# Patient Record
Sex: Female | Born: 1995 | Race: White | Hispanic: Yes | Marital: Single | Smoking: Former smoker
Health system: Southern US, Community
[De-identification: ages and names within clinical notes are randomized; demographics above are authoritative.]

## PROBLEM LIST (undated history)

## (undated) DIAGNOSIS — J45909 Unspecified asthma, uncomplicated: Secondary | ICD-10-CM

## (undated) DIAGNOSIS — F32A Depression, unspecified: Secondary | ICD-10-CM

## (undated) DIAGNOSIS — F329 Major depressive disorder, single episode, unspecified: Secondary | ICD-10-CM

## (undated) DIAGNOSIS — F419 Anxiety disorder, unspecified: Secondary | ICD-10-CM

## (undated) HISTORY — DX: Major depressive disorder, single episode, unspecified: F32.9

## (undated) HISTORY — DX: Anxiety disorder, unspecified: F41.9

## (undated) HISTORY — DX: Depression, unspecified: F32.A

---
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
## 2015-04-23 ENCOUNTER — Encounter (HOSPITAL_COMMUNITY): Payer: Self-pay | Admitting: Emergency Medicine

## 2015-04-23 ENCOUNTER — Emergency Department (HOSPITAL_COMMUNITY)
Admission: EM | Admit: 2015-04-23 | Discharge: 2015-04-23 | Disposition: A | Discharge status: Home / Self Care | Payer: Federal, State, Local not specified - PPO | Attending: Emergency Medicine | Admitting: Emergency Medicine

## 2015-04-23 ENCOUNTER — Emergency Department (HOSPITAL_COMMUNITY): Payer: Federal, State, Local not specified - PPO

## 2015-04-23 DIAGNOSIS — Z79899 Other long term (current) drug therapy: Secondary | ICD-10-CM | POA: Diagnosis not present

## 2015-04-23 DIAGNOSIS — Y9389 Activity, other specified: Secondary | ICD-10-CM | POA: Diagnosis not present

## 2015-04-23 DIAGNOSIS — S92421A Displaced fracture of distal phalanx of right great toe, initial encounter for closed fracture: Secondary | ICD-10-CM | POA: Insufficient documentation

## 2015-04-23 DIAGNOSIS — S99921A Unspecified injury of right foot, initial encounter: Secondary | ICD-10-CM | POA: Diagnosis present

## 2015-04-23 DIAGNOSIS — Y92410 Unspecified street and highway as the place of occurrence of the external cause: Secondary | ICD-10-CM | POA: Diagnosis not present

## 2015-04-23 DIAGNOSIS — Y998 Other external cause status: Secondary | ICD-10-CM | POA: Insufficient documentation

## 2015-04-23 DIAGNOSIS — Z7951 Long term (current) use of inhaled steroids: Secondary | ICD-10-CM | POA: Insufficient documentation

## 2015-04-23 DIAGNOSIS — Z720 Tobacco use: Secondary | ICD-10-CM | POA: Insufficient documentation

## 2015-04-23 DIAGNOSIS — S92401A Displaced unspecified fracture of right great toe, initial encounter for closed fracture: Secondary | ICD-10-CM

## 2015-04-23 DIAGNOSIS — J45909 Unspecified asthma, uncomplicated: Secondary | ICD-10-CM | POA: Insufficient documentation

## 2015-04-23 MED ORDER — IBUPROFEN 800 MG PO TABS
800.0000 mg | ORAL_TABLET | Freq: Once | ORAL | Status: AC
  Administered 2015-04-23: 800 mg via ORAL
  Filled 2015-04-23: qty 1

## 2015-04-23 MED ORDER — IBUPROFEN 800 MG PO TABS: 800.0000 mg | ORAL_TABLET | Freq: Three times a day (TID) | ORAL | Status: AC
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
## 2015-04-23 NOTE — Discharge Instructions (Signed)
1. Medications: ibuprofen, usual home medications 2. Treatment: rest, drink plenty of fluids, rest, ice, elevate, wear post-op shoe 3. Follow Up: please followup with orthopedics for discussion of your diagnoses and further evaluation after today's visit; please return to the ER for severe pain or swelling, numbness, weakness, new or worsening symptoms   Toe Fracture A toe fracture is a break in one of the toe bones (phalanges). CAUSES This condition may be caused by:  Dropping a heavy object on your toe.  Stubbing your toe.  Overusing your toe or doing repetitive exercise.  Twisting or stretching your toe out of place. RISK FACTORS This condition is more likely to develop in people who:  Play contact sports.  Have a bone disease.  Have a low calcium level. SYMPTOMS The main symptoms of this condition are swelling and pain in the toe. The pain may get worse with standing or walking. Other symptoms include:  Bruising.  Stiffness.  Numbness.  A change in the way the toe looks.  Broken bones that poke through the skin.  Blood beneath the toenail. DIAGNOSIS This condition is diagnosed with a physical exam. You may also have X-rays. TREATMENT  Treatment for this condition depends on the type of fracture and its severity. Treatment may involve:  Taping the broken toe to a toe that is next to it (buddy taping). This is the most common treatment for fractures in which the bone has not moved out of place (nondisplaced fracture).  Wearing a shoe that has a wide, rigid sole to protect the toe and to limit its movement.  Wearing a walking cast.  Having a procedure to move the toe back into place.  Surgery. This may be needed:  If there are many pieces of broken bone that are out of place (displaced).  If the toe joint breaks.  If the bone breaks through the skin.  Physical therapy. This is done to help regain movement and strength in the toe. You may need follow-up  X-rays to make sure that the bone is healing well and staying in position. HOME CARE INSTRUCTIONS If You Have a Cast:  Do not stick anything inside the cast to scratch your skin. Doing that increases your risk of infection.  Check the skin around the cast every day. Report any concerns to your health care provider. You may put lotion on dry skin around the edges of the cast. Do not apply lotion to the skin underneath the cast.  Do not put pressure on any part of the cast until it is fully hardened. This may take several hours.  Keep the cast clean and dry. Bathing  Do not take baths, swim, or use a hot tub until your health care provider approves. Ask your health care provider if you can take showers. You may only be allowed to take sponge baths for bathing.  If your health care provider approves bathing and showering, cover the cast or bandage (dressing) with a watertight plastic bag to protect it from water. Do not let the cast or dressing get wet. Managing Pain, Stiffness, and Swelling  If you do not have a cast, apply ice to the injured area, if directed.  Put ice in a plastic bag.  Place a towel between your skin and the bag.  Leave the ice on for 20 minutes, 2-3 times per day.  Move your toes often to avoid stiffness and to lessen swelling.  Raise (elevate) the injured area above the level of your  heart while you are sitting or lying down. Driving  Do not drive or operate heavy machinery while taking pain medicine.  Do not drive while wearing a cast on a foot that you use for driving. Activity  Return to your normal activities as directed by your health care provider. Ask your health care provider what activities are safe for you.  Perform exercises daily as directed by your health care provider or physical therapist. Safety  Do not use the injured limb to support your body weight until your health care provider says that you can. Use crutches or other assistive devices  as directed by your health care provider. General Instructions  If your toe was treated with buddy taping, follow your health care provider's instructions for changing the gauze and tape. Change it more often:  The gauze and tape get wet. If this happens, dry the space between the toes.  The gauze and tape are too tight and cause your toe to become pale or numb.  Wear a protective shoe as directed by your health care provider. If you were not given a protective shoe, wear sturdy, supportive shoes. Your shoes should not pinch your toes and should not fit tightly against your toes.  Do not use any tobacco products, including cigarettes, chewing tobacco, or e-cigarettes. Tobacco can delay bone healing. If you need help quitting, ask your health care provider.  Take medicines only as directed by your health care provider.  Keep all follow-up visits as directed by your health care provider. This is important. SEEK MEDICAL CARE IF:  You have a fever.  Your pain medicine is not helping.  Your toe is cold.  Your toe is numb.  You still have pain after one week of rest and treatment.  You still have pain after your health care provider has said that you can start walking again.  You have pain, tingling, or numbness in your foot that is not going away. SEEK IMMEDIATE MEDICAL CARE IF:  You have severe pain.  You have redness or inflammation in your toe that is getting worse.  You have pain or numbness in your toe that is getting worse.  Your toe turns blue.   This information is not intended to replace advice given to you by your health care provider. Make sure you discuss any questions you have with your health care provider.   Document Released: 06/01/2000 Document Revised: 02/23/2015 Document Reviewed: 03/31/2014 Elsevier Interactive Patient Education 2016 [employer].   Emergency Department Resource Guide 1) Find a Doctor and Pay Out of Pocket Although you won't have to  find out who is covered by your insurance plan, it is a good idea to ask around and get recommendations. You will then need to call the office and see if the doctor you have chosen will accept you as a new patient and what types of options they offer for patients who are self-pay. Some doctors offer discounts or will set up payment plans for their patients who do not have insurance, but you will need to ask so you aren't surprised when you get to your appointment.  2) Contact Your Local Health Department Not all health departments have doctors that can see patients for sick visits, but many do, so it is worth a call to see if yours does. If you don't know where your local health department is, you can check in your phone book. The CDC also has a tool to help you locate your state's health  department, and many state websites also have listings of all of their local health departments.  3) Find a Walk-in Clinic If your illness is not likely to be very severe or complicated, you may want to try a walk in clinic. These are popping up all over the country in pharmacies, drugstores, and shopping centers. They're usually staffed by nurse practitioners or physician assistants that have been trained to treat common illnesses and complaints. They're usually fairly quick and inexpensive. However, if you have serious medical issues or chronic medical problems, these are probably not your best option.  No Primary Care Doctor: - Call Health Connect at  [phone] - they can help you locate a primary care doctor that  accepts your insurance, provides certain services, etc. - Physician Referral Service- [phone]  Chronic Pain Problems: Organization         Address  Phone   Notes  [facility]  [phone] Patients need to be referred by their primary care doctor.   Medication Assistance: Organization         Address  Phone   Notes  [facility] Medication [facility] [address] [place], [place] [address] [phone] --Must be a resident of [facility] -- Must have NO insurance coverage whatsoever (no Medicaid/ Medicare, etc.) -- The pt. MUST have a primary care doctor that directs their care regularly and follows them in the community   [facility]  [phone]   [employer]  [phone]    Agencies that provide inexpensive medical care: Organization         Address  Phone   Notes  [facility]  [phone]   [facility]    [phone]   [facility] [address] [place], [place] [address] [phone]   [facility] [address] [place] [phone]   Planned Parenthood    [phone]   [facility]    [phone]   [facility]  [address], [place] Phone:  [phone], Fax:  [phone] Hours of Operation:  9 am - 6 pm, M-F.  Also accepts Medicaid/Medicare and self-pay.  [facility]  [address], [place] Phone: [phone], Fax: [phone]. Hours of Operation:  8:30 am - 5:30 pm, M-F.  Also accepts Medicaid and self-pay.  [facility] [place] [address] [place] Phone: [phone]   [facility] [address] Natasha Bence [place], [place] [phone], Ext. [phone] Mondays & Thursdays: 7-9 AM.  First 15 patients are seen on a first come, first serve basis.    Medicaid-accepting [facility] Providers:  Organization         Address  Phone   Notes  [facility] [address], [place] [phone] Also accepts self-pay patients.  [facility] [address] Laurell Josephs [place], [place]  [phone]   [facility] [address], [place] [phone]   [facility] [address] [place] [phone]   Renaye Rakers [address], [place]   [phone] Only accepts [place] Access [place] patients after they have their name applied to their card.   Self-Pay (no insurance) in [facility]:  Organization  Address  Phone   Notes  Sickle Cell Patients, [facility] [address] [place], [place] [phone]   [facility] [address] [place], [place] [phone]   [facility] [place]  [address], [place] [phone]   [facility]/Dr. Osei-Bonsu  [address], [place] or [address], [place] [phone] Phone number for both [place] and [place] locations is the same.  [facility] [address] [place] [phone]   [facility] [address], [place] or [address] [phone]   [facility] [address], [place] [phone], phone; [phone], fax Sees patients 1st and 3rd Saturday of every month.  Must not qualify for public or private insurance (i.e. Medicaid, Medicare, [place] Health Choice, Veterans' Benefits)  Household income should be no more than 200% of the poverty level The clinic cannot treat you if you are pregnant or think you are pregnant  Sexually transmitted diseases are not treated at the clinic.    Dental Care: Organization         Address  Phone  Notes  [facility] of [facility] [address] [place], [place] [phone] Accepts children up to age 56 who are enrolled in [place] or [place] Health Choice; pregnant women with a Medicaid card; and children who have applied for Medicaid or [place] Health Choice, but were declined, whose parents can pay a reduced fee at time of service.  [facility]  [address], [place] [phone] Accepts children up to age  51 who are enrolled in [place] or [place] Health Choice; pregnant women with a Medicaid card; and children who have applied for Medicaid or [place] Health Choice, but were declined, whose parents can pay a reduced fee at time of service.  [facility]  [address] [place], [place] [phone] Patients are seen by appointment only. Walk-ins are not accepted. [facility] will see patients 22 years of age and older. Monday - Tuesday (8am-5pm) Most Wednesdays (8:30-5pm) $30 per visit, cash only  [facility]  [address], [facility] [phone] Patients are seen by appointment only. Walk-ins are not accepted. [facility] will see patients 74 years of age and older. One Wednesday Evening (Monthly: Volunteer Based).  $30 per visit, cash only  [employer]  [phone] for adults; Children under age 78, call Graduate Pediatric Dentistry at [phone]. Children aged 41-14, please call [phone] to request a pediatric application.  Dental services are provided in all areas of dental care including fillings, crowns and bridges, complete and partial dentures, implants, gum treatment, root canals, and extractions. Preventive care is also provided. Treatment is provided to both adults and children. Patients are selected via a lottery and there is often a waiting list.   [facility] [address], [place]  [phone] [email or web address]   [facility] [address] [place], [place] [phone], Ext. [phone] Second and Fourth Thursday of each month, opens at 6:30 AM; Clinic ends at 9 AM.  Patients are seen on a first-come first-served basis, and a limited number are seen during each clinic.   [facility]  [address] [place],  [place] [phone]   Eligibility Requirements You must have lived in [place], [place], or [place] counties for at least the last three  months.   You cannot be eligible for state or federal sponsored [place], including [employer], [place], or [employer].   You generally cannot be eligible for healthcare insurance through your employer.    How to apply: Eligibility screenings are held every Tuesday and Wednesday afternoon from 1:00 pm until 4:00 pm. You do not need an appointment for the interview!  [facility] [address], [place], [place] [phone]   [facility] Health Department  [phone]   [facility] Health Department  [phone]   [facility] Health Department  [phone]    Behavioral Health Resources in the Community: Intensive Outpatient Programs Organization         Address  Phone  Notes  [facility] [address], [place], [place] [phone]   [facility] Outpatient [address] [place], [place] [phone]   [facility] [address], [place], [place]  [phone]   [facility] [address]  [place], [place] [phone] or [phone]   Substance Abuse Resources Organization         Address  Phone  Notes  [facility]  [phone]   [facility]  [phone]   [facility] [place]  [phone]   Floydene Flock  [phone]   Residential & Outpatient Substance Abuse Program  [phone]   Psychological Services Organization         Address  Phone  Notes  [facility] Behavioral Health  [phone]   [facility] Services  [phone]   [facility] Mental Health [address] [place] [phone] or [phone]    Mobile Crisis Teams Organization         Address  Phone  Notes  [facility], Mobile Crisis Care Unit  [phone]   [facility]  [address] [place], [place] [phone]   Doristine Locks [address] [place] [phone]    Self-Help/Support  Groups Organization         Address  Phone             Notes  [facility] [place] - variety of support groups  336- I7437963 Call for more information  Narcotics Anonymous (NA), [facility] [address], [employer] [place]  2 meetings at this location   Statistician         Address  Phone  Notes  [facility] [address],    [place]  [phone]   [facility]  [address], [place] [address], [place], [place] [phone]   [facility] [address] [place], [place] [phone] Admissions: 8am-3pm M-F  [facility] [address],    [place], [place] [phone]   [facility] [address] Starling Manns [place], [place] [phone]   [facility] [address],  [place], [place] [phone]   [facility] - Intensive Outpatient [address], [place], [place] [phone]   [facility] ([facility] [address] [place]  [place], [place] [phone] or [phone]   [facility] ([facility]) [address], [place], [place] [phone] Accepts Medicaid  [facility] [address],  [place] [phone] Substance Abuse/Addiction Treatment   [facility] Resources Organization  Address  Phone  Notes  [facility]  [phone]) [phone]   Angie FavaJulie Brannon, PhD [address] [place], [place]   [phone]) [phone] or [phone](336) [phone]   [facility]   [address] [place], [place] [phone]) [phone]   [facility] [address], [place], [place] [phone]) [phone] Insurance/Medicaid/sponsorship through [facility] [address]                                    [place], [place] [phone]) [phone] Therapy/tele-psych/case  [facility] [address]   [place], [place] [phone]) [phone]    Dr. Lolly MustacheArfeen  [phone](336) [phone]   [facility] [place]  [employer] [facility] 1) [address], [place] 2) [address], [place] 3)  [address], [place] [phone]) [phone]) [phone]  [phone](336) [phone]   [facility] Child Abuse Hotline [phone](336) [phone] or [phone](336) [phone] (After Hours)
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
## 2015-05-18 ENCOUNTER — Encounter (HOSPITAL_COMMUNITY): Payer: Self-pay | Admitting: Psychiatry

## 2015-05-18 ENCOUNTER — Other Ambulatory Visit (HOSPITAL_COMMUNITY): Payer: Federal, State, Local not specified - PPO | Attending: Psychiatry | Admitting: Psychiatry

## 2015-05-18 DIAGNOSIS — F17210 Nicotine dependence, cigarettes, uncomplicated: Secondary | ICD-10-CM | POA: Insufficient documentation

## 2015-05-18 DIAGNOSIS — F419 Anxiety disorder, unspecified: Secondary | ICD-10-CM | POA: Diagnosis not present

## 2015-05-18 DIAGNOSIS — G4710 Hypersomnia, unspecified: Secondary | ICD-10-CM | POA: Insufficient documentation

## 2015-05-18 DIAGNOSIS — R45851 Suicidal ideations: Secondary | ICD-10-CM | POA: Insufficient documentation

## 2015-05-18 DIAGNOSIS — Z79899 Other long term (current) drug therapy: Secondary | ICD-10-CM | POA: Insufficient documentation

## 2015-05-18 DIAGNOSIS — F331 Major depressive disorder, recurrent, moderate: Secondary | ICD-10-CM | POA: Insufficient documentation
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
## 2015-05-18 NOTE — Progress Notes (Signed)
    Daily Group Progress Note  Program: IOP  Group Time: 9:00-10:30  Participation Level: Active  Behavioral Response: Appropriate  Type of Therapy:  Group Therapy  Summary of Progress: Pt. Met with psychiatrist and case manager.      Group Time: 10:30-12:00  Participation Level:  Active  Behavioral Response: Appropriate  Type of Therapy: Psycho-education Group  Summary of Progress: Pt. Participated in discussion about highly sensitive people personality traits. Pt. Shared pattern of people pleasing and compliant personality in relationships.   Nancie Neas, LPC
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
## 2015-05-19 ENCOUNTER — Other Ambulatory Visit (HOSPITAL_COMMUNITY): Payer: Federal, State, Local not specified - PPO | Attending: Psychiatry | Admitting: Psychiatry

## 2015-05-19 DIAGNOSIS — F418 Other specified anxiety disorders: Secondary | ICD-10-CM | POA: Diagnosis not present

## 2015-05-19 DIAGNOSIS — F331 Major depressive disorder, recurrent, moderate: Secondary | ICD-10-CM | POA: Diagnosis not present

## 2015-05-20 ENCOUNTER — Other Ambulatory Visit (HOSPITAL_COMMUNITY): Payer: Federal, State, Local not specified - PPO | Admitting: Psychiatry

## 2015-05-20 DIAGNOSIS — F331 Major depressive disorder, recurrent, moderate: Secondary | ICD-10-CM | POA: Diagnosis not present

## 2015-05-20 NOTE — Progress Notes (Signed)
    Daily Group Progress Note  Program: IOP  Group Time: 9:00-10:30  Participation Level: Active  Behavioral Response: Appropriate  Type of Therapy:  Group Therapy  Summary of Progress: Pt. Presented as talkative, engaged in group process, reports that mood is brightening. Pt. Reported that she did not go to work yesterday, went to lunch and had ice cream with her mother. Pt. Reported that she had work on her tattoos and went to the park. Pt. Discussed yoga practice and wanting to return to her practice.    Group Time:  10:30-12:00  Participation Level:  Active  Behavioral Response: Appropriate  Type of Therapy: Psycho-education Group  Summary of Progress: Pt. Participated in grief and loss group with Theda BelfastBob Hamilton.  Shaune PollackBrown, Jennifer B, LPC

## 2015-05-23 ENCOUNTER — Other Ambulatory Visit (HOSPITAL_COMMUNITY): Payer: Federal, State, Local not specified - PPO | Admitting: Psychiatry

## 2015-05-23 DIAGNOSIS — F331 Major depressive disorder, recurrent, moderate: Secondary | ICD-10-CM | POA: Diagnosis not present

## 2015-05-23 NOTE — Progress Notes (Signed)
    Daily Group Progress Note  Program: IOP  Group Time: 9:00-10:30  Participation Level: Active  Behavioral Response: Appropriate  Type of Therapy:  Group Therapy  Summary of Progress: Pt. Presents with brightened mood, talkative, engaged in the group process. Pt. Discussed recent stressors including relationship with her father who has been chemical dependent all of her life and conflicted emotions about having a relationship with him. Pt. Continues to grieve loss of relationship with friends and learning how to manage her anxiety.      Group Time: 10:30-12:00  Participation Level:  Active  Behavioral Response: Appropriate  Type of Therapy: Psycho-education Group  Summary of Progress: Pt. Participated in discussion about use of aromatherapy for stimulating energy and grounding and developing understanding of personality styles (i.e. Compliant, controlling, and avoidant) and how they affect development of boundaries in relationships.  Shaune PollackBrown, Keighley Deckman B, LPC

## 2015-05-23 NOTE — Progress Notes (Signed)
    Daily Group Progress Note  Program: IOP  Group Time: 9:00-10:30  Participation Level: Active  Behavioral Response: Appropriate  Type of Therapy:  Psycho-education Group  Summary of Progress: Pt. Participated in [facility] education group with [facility].     Group Time: 10:30-12:00  Participation Level:  Active  Behavioral Response: Appropriate  Type of Therapy: Group Therapy  Summary of Progress: Pt. Presented as tired, but talkative, and engaged in the group process. Rep[place] that [place] to [place] moving there. Pt. Returned on Sunday to help her sister move. Pt. Reported that she felt physically tired, but was going to work this afternoon. Participated in discussion about mindfulness meditation and how it can be used to help with emotional regulation and processing distressing emotions such as anger.   Shaune PollackBrown, Audria Takeshita B, LPC

## 2015-05-24 ENCOUNTER — Other Ambulatory Visit (HOSPITAL_COMMUNITY): Payer: Federal, State, Local not specified - PPO | Admitting: Psychiatry

## 2015-05-24 DIAGNOSIS — F331 Major depressive disorder, recurrent, moderate: Secondary | ICD-10-CM

## 2015-05-24 NOTE — Progress Notes (Signed)
    Daily Group Progress Note  Program: IOP  Group Time: 9:00-10:30  Participation Level: Active  Behavioral Response: Appropriate  Type of Therapy:  Group Therapy  Summary of Progress: Pt. Presented as tired, but talkative and engaged in the group process. Pt. 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 Pt. Discussed plans [place] and develop a healthier lifestyle and less stress.     Group Time: 10:30-12:00  Participation Level:  Active  Behavioral Response: Appropriate  Type of Therapy: Psycho-education Group  Summary of Progress: Pt. Watched Best [employer]Brene Brown video about use of vulnerability and destructive coping behaviors. Pt. Discussed patterns of numbing and connection between her diet and IBS symptoms.   Shaune PollackBrown, Jennifer B, LPC

## 2015-05-25 ENCOUNTER — Other Ambulatory Visit (HOSPITAL_COMMUNITY): Payer: Federal, State, Local not specified - PPO | Admitting: Psychiatry

## 2015-05-25 DIAGNOSIS — F331 Major depressive disorder, recurrent, moderate: Secondary | ICD-10-CM | POA: Diagnosis not present

## 2015-05-25 NOTE — Progress Notes (Signed)
    Daily Group Progress Note  Program: IOP  Group Time: 9:00-12:00  Participation Level: Active  Behavioral Response: Appropriate  Type of Therapy:  Group Therapy  Summary of Progress: Pt. Presented with bright affect, talkative, engaged in the group process, appropriately tearful. Pt. Processed patterns of poor boundaries in relationships with friends, role of shame in relationships and not feeling worthy of healthy relationships. Pt. Requested feedback from the group regarding conversation she is planning with her mother that has triggered fears of abandonment.     Group Time: 10:30-12:00  Participation Level:  Active  Behavioral Response: Appropriate  Type of Therapy: Psycho-education Group  Summary of Progress: Pt. Participated in discussion about cognitive distortions including all-or-nothing thinking, overgeneralization, mental filter, discounting the positive, jumping to conclusions, and personalization.   Shaune PollackBrown, Jennifer B, LPC

## 2015-05-26 ENCOUNTER — Other Ambulatory Visit (HOSPITAL_COMMUNITY): Payer: Federal, State, Local not specified - PPO | Admitting: Psychiatry

## 2015-05-26 DIAGNOSIS — F331 Major depressive disorder, recurrent, moderate: Secondary | ICD-10-CM

## 2015-05-26 NOTE — Progress Notes (Signed)
    Daily Group Progress Note  Program: IOP  Group Time: 9:00-10:30  Participation Level: Active  Behavioral Response: Appropriate  Type of Therapy:  Group Therapy  Summary of Progress: Pt. Presented with brightened affect, smiled appropriately, talkative, and engaged in the group process. Pt. Shared that meeting with her mother was positive, she was able to communicate with her mother, and is continuing to work through fears of abandonment.     Group Time: 10:30-12:00  Participation Level:  Active  Behavioral Response: Appropriate  Type of Therapy: Psycho-education Group  Summary of Progress:  Pt. Watched and discussed Levonne HubertJia Jiang video about learning to face fears related to rejection. Pt. Discussed pattern of fearing personal rejection and not saying "no" or asking for what she needs in personal relationships.  Shaune PollackBrown, Finnlee Guarnieri B, LPC

## 2015-05-27 ENCOUNTER — Other Ambulatory Visit (HOSPITAL_COMMUNITY): Payer: Federal, State, Local not specified - PPO | Admitting: Psychiatry

## 2015-05-27 DIAGNOSIS — F331 Major depressive disorder, recurrent, moderate: Secondary | ICD-10-CM

## 2015-05-27 NOTE — Progress Notes (Signed)
    Daily Group Progress Note  Program: IOP  Group Time:  9:00-10:30  Participation Level: Active  Behavioral Response: Appropriate  Type of Therapy:  Group Therapy  Summary of Progress: Pt. Presented with bright affect, reported that she felt tired and attributed to staying up until 5:00am this morning. Pt. Discussed work stress and conflict with a co-worker who is a Aeronautical engineermicro-manager and is fault finding of her work. Pt. Discussed looking forward to the weekend because she will be able to sleep late before going to work.      Group Time: 10:30-12:00  Participation Level:  Active  Behavioral Response: Appropriate  Type of Therapy: Psycho-education Group  Summary of Progress: Pt. Participated in grief and loss group with Theda BelfastBob Hamilton.  Shaune PollackBrown, Jennifer B, LPC

## 2015-05-30 ENCOUNTER — Other Ambulatory Visit (HOSPITAL_COMMUNITY): Payer: Federal, State, Local not specified - PPO | Admitting: Psychiatry

## 2015-05-30 DIAGNOSIS — F331 Major depressive disorder, recurrent, moderate: Secondary | ICD-10-CM | POA: Diagnosis not present

## 2015-05-31 ENCOUNTER — Other Ambulatory Visit (HOSPITAL_COMMUNITY): Payer: Federal, State, Local not specified - PPO | Admitting: Psychiatry

## 2015-05-31 DIAGNOSIS — F331 Major depressive disorder, recurrent, moderate: Secondary | ICD-10-CM

## 2015-05-31 MED ORDER — GABAPENTIN 300 MG PO CAPS: 300.0000 mg | ORAL_CAPSULE | Freq: Three times a day (TID) | ORAL | Status: AC
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
## 2015-05-31 NOTE — Patient Instructions (Signed)
Patient completed MH-IOP today.  Will follow up with Forde RadonLeAnne Yates, LPC  On 06-28-15@ 10 a.m and Dr. Lolly MustacheArfeen on 06-29-15 @ 9 a.m..  Encouraged support groups.
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
## 2015-05-31 NOTE — Progress Notes (Signed)
    Daily Group Progress Note  Program: IOP  Group Time: 9:00-10:30  Participation Level: Active  Behavioral Response: Appropriate  Type of Therapy:  Group Therapy  Summary of Progress: Pt. Presented with brightened affect. Pt. Prepared for discharge, discussed new relationship that she is happy about, feeling the support of her family, and actively making plans to [place] and [place].     Group Time: 10:30-12:00  Participation Level:  Active  Behavioral Response: Appropriate  Type of Therapy: Psycho-education Group  Summary of Progress: Pt. Participated in discussion about use of meditation, breathwork and guided visualization for management of depression. Pt. Watched and discussed Shawn Achor video about wellness habits.  Boneta LucksJennifer Moustapha Tooker, Ph.D., [facility]

## 2015-06-01 ENCOUNTER — Other Ambulatory Visit (HOSPITAL_COMMUNITY): Payer: Federal, State, Local not specified - PPO

## 2015-06-02 ENCOUNTER — Other Ambulatory Visit (HOSPITAL_COMMUNITY): Payer: Federal, State, Local not specified - PPO

## 2015-06-03 ENCOUNTER — Other Ambulatory Visit (HOSPITAL_COMMUNITY): Payer: Federal, State, Local not specified - PPO

## 2015-06-06 ENCOUNTER — Other Ambulatory Visit (HOSPITAL_COMMUNITY): Payer: Federal, State, Local not specified - PPO

## 2015-06-07 ENCOUNTER — Other Ambulatory Visit (HOSPITAL_COMMUNITY): Payer: Federal, State, Local not specified - PPO

## 2015-06-08 ENCOUNTER — Other Ambulatory Visit (HOSPITAL_COMMUNITY): Payer: Federal, State, Local not specified - PPO

## 2015-06-09 ENCOUNTER — Other Ambulatory Visit (HOSPITAL_COMMUNITY): Payer: Federal, State, Local not specified - PPO

## 2015-06-10 ENCOUNTER — Other Ambulatory Visit (HOSPITAL_COMMUNITY): Payer: Federal, State, Local not specified - PPO

## 2015-06-14 ENCOUNTER — Other Ambulatory Visit (HOSPITAL_COMMUNITY): Payer: Federal, State, Local not specified - PPO

## 2015-06-15 ENCOUNTER — Other Ambulatory Visit (HOSPITAL_COMMUNITY): Payer: Federal, State, Local not specified - PPO

## 2015-06-16 ENCOUNTER — Other Ambulatory Visit (HOSPITAL_COMMUNITY): Payer: Federal, State, Local not specified - PPO

## 2015-06-17 ENCOUNTER — Other Ambulatory Visit (HOSPITAL_COMMUNITY): Payer: Federal, State, Local not specified - PPO

## 2015-06-21 ENCOUNTER — Other Ambulatory Visit (HOSPITAL_COMMUNITY): Payer: Federal, State, Local not specified - PPO

## 2015-06-22 ENCOUNTER — Other Ambulatory Visit (HOSPITAL_COMMUNITY): Payer: Federal, State, Local not specified - PPO

## 2015-06-23 ENCOUNTER — Other Ambulatory Visit (HOSPITAL_COMMUNITY): Payer: Federal, State, Local not specified - PPO

## 2015-06-24 ENCOUNTER — Other Ambulatory Visit (HOSPITAL_COMMUNITY): Payer: Federal, State, Local not specified - PPO

## 2015-06-27 ENCOUNTER — Other Ambulatory Visit (HOSPITAL_COMMUNITY): Payer: Federal, State, Local not specified - PPO

## 2015-06-28 ENCOUNTER — Other Ambulatory Visit (HOSPITAL_COMMUNITY): Payer: Federal, State, Local not specified - PPO

## 2015-06-28 ENCOUNTER — Ambulatory Visit (HOSPITAL_COMMUNITY): Payer: Self-pay | Admitting: Psychology

## 2015-06-29 ENCOUNTER — Encounter (HOSPITAL_COMMUNITY): Payer: Self-pay | Admitting: Psychiatry

## 2015-06-29 ENCOUNTER — Ambulatory Visit (INDEPENDENT_AMBULATORY_CARE_PROVIDER_SITE_OTHER): Payer: Federal, State, Local not specified - PPO | Admitting: Psychiatry

## 2015-06-29 ENCOUNTER — Other Ambulatory Visit (HOSPITAL_COMMUNITY): Payer: Federal, State, Local not specified - PPO

## 2015-06-29 VITALS — BP 124/73 | HR 76 | Ht 67.0 in | Wt 186.0 lb

## 2015-06-29 DIAGNOSIS — F331 Major depressive disorder, recurrent, moderate: Secondary | ICD-10-CM | POA: Diagnosis not present

## 2015-06-29 MED ORDER — FLUOXETINE HCL 40 MG PO CAPS: 40.0000 mg | ORAL_CAPSULE | Freq: Every day | ORAL | Status: AC
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
## 2015-06-30 ENCOUNTER — Other Ambulatory Visit (HOSPITAL_COMMUNITY): Payer: Federal, State, Local not specified - PPO

## 2015-07-01 ENCOUNTER — Other Ambulatory Visit (HOSPITAL_COMMUNITY): Payer: Federal, State, Local not specified - PPO

## 2015-07-04 ENCOUNTER — Other Ambulatory Visit (HOSPITAL_COMMUNITY): Payer: Federal, State, Local not specified - PPO

## 2015-07-05 ENCOUNTER — Other Ambulatory Visit (HOSPITAL_COMMUNITY): Payer: Federal, State, Local not specified - PPO

## 2015-07-06 ENCOUNTER — Other Ambulatory Visit (HOSPITAL_COMMUNITY): Payer: Federal, State, Local not specified - PPO

## 2015-07-07 ENCOUNTER — Other Ambulatory Visit (HOSPITAL_COMMUNITY): Payer: Federal, State, Local not specified - PPO

## 2015-08-29 ENCOUNTER — Ambulatory Visit (HOSPITAL_COMMUNITY): Payer: Self-pay | Admitting: Psychiatry

## 2016-05-21 IMAGING — CR DG FOOT COMPLETE 3+V*R*
3 series · 3 of 3 positions shown · non-contrast
Comparison: None.

CLINICAL DATA: Pain and swelling in the first ray of the right foot
after recent injury.

EXAM:
RIGHT FOOT COMPLETE - 3+ VIEW

[x foot ap right]
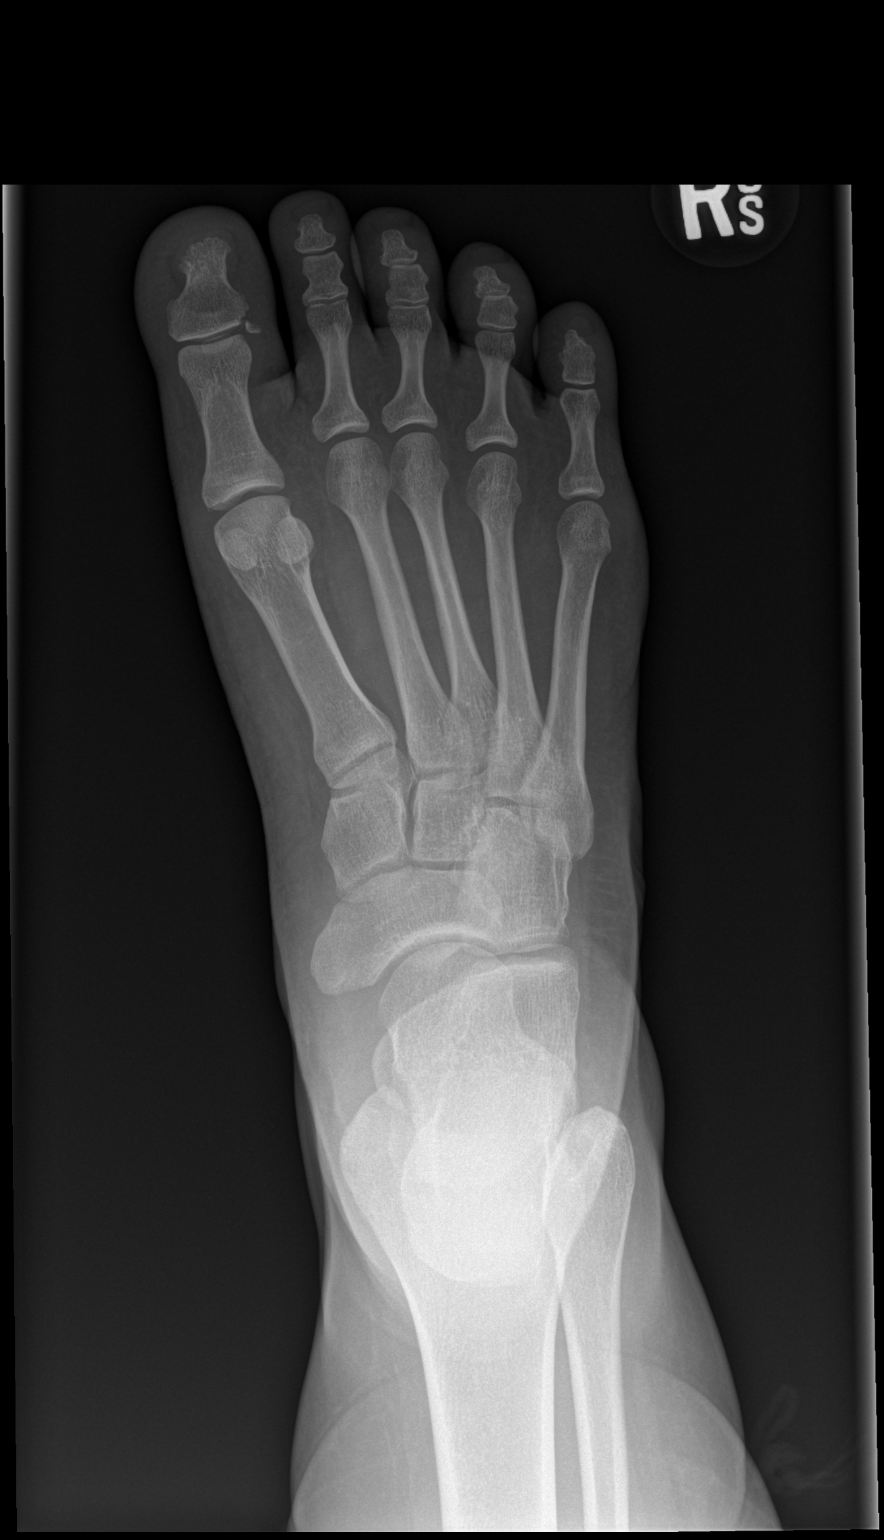

[x foot obl right]
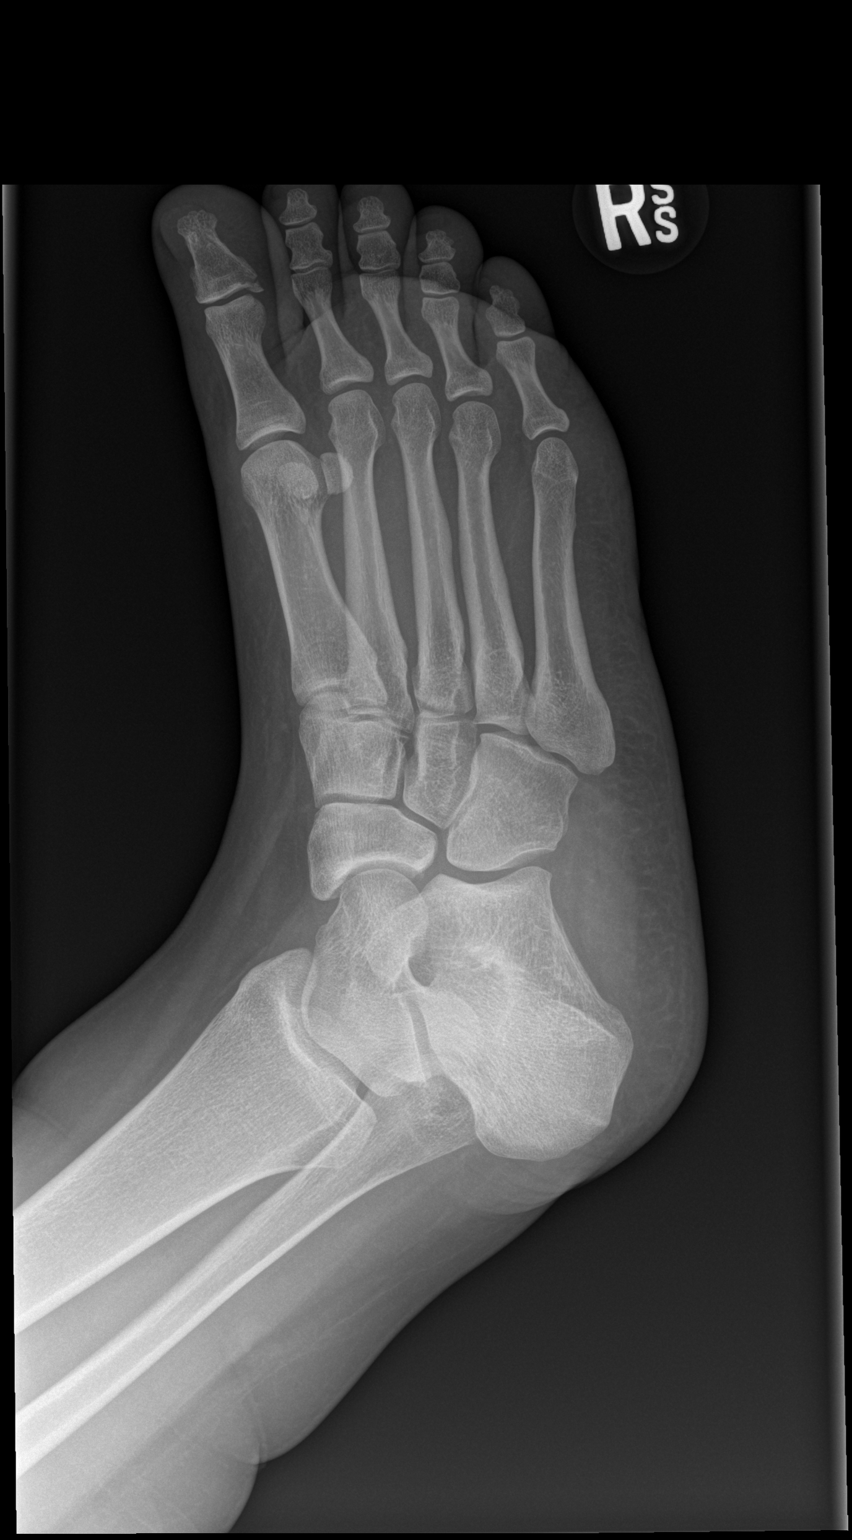

[x foot lat right]
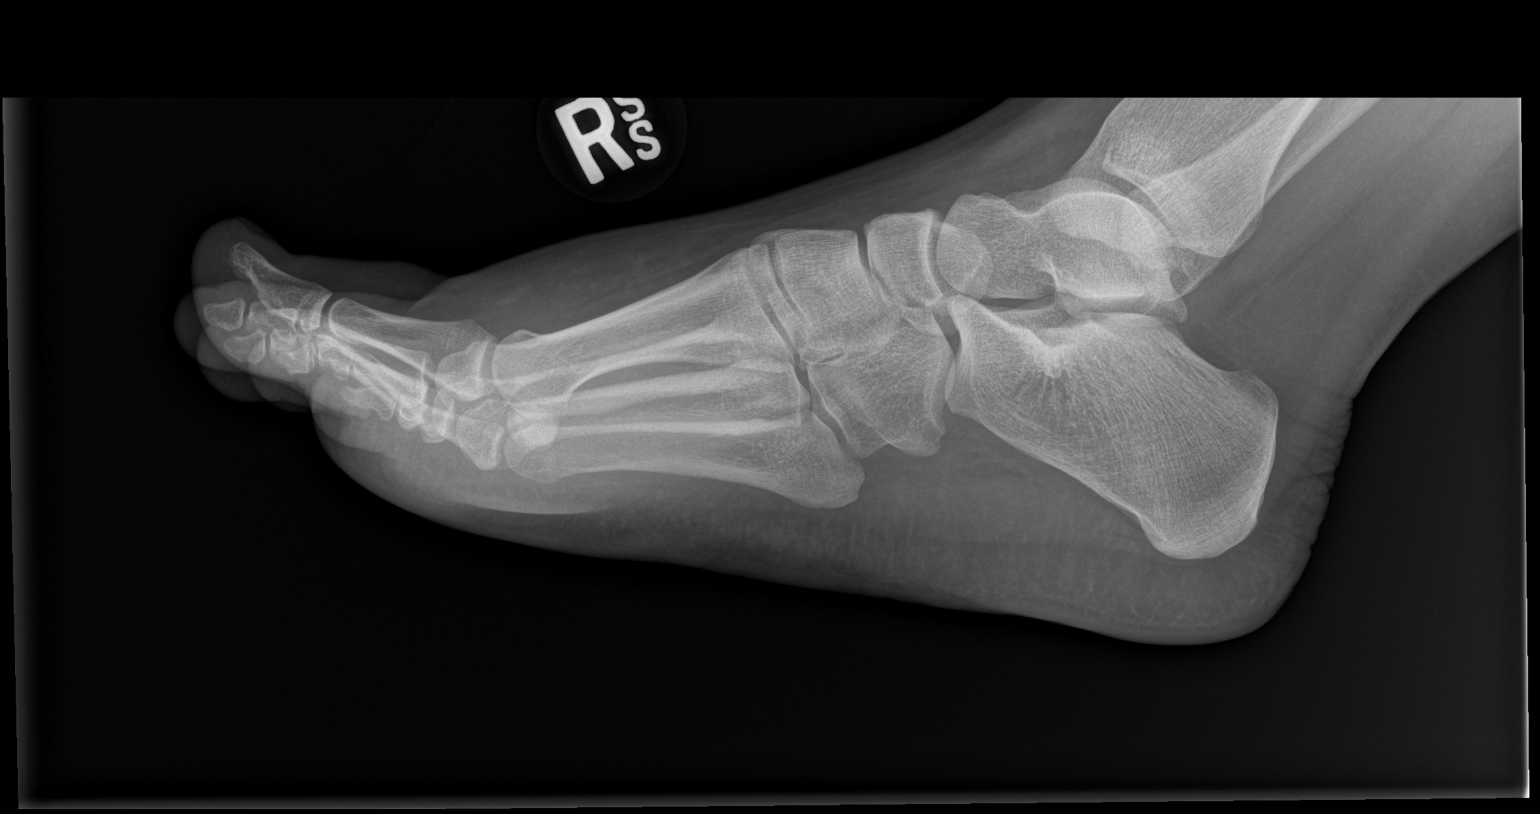

[3 of 3 positions shown; findings below may reference images not displayed]

FINDINGS: There is a tiny avulsion fracture at the lateral base of the distal
phalanx in the right first toe, with minimal 2 mm proximal
displacement of the avulsion fracture fragment. No additional
fracture is seen in the right foot. No dislocation. Lisfranc joint
appears intact. No suspicious focal osseous lesion. Joint spaces
otherwise appear normal.
IMPRESSION: Minimally displaced avulsion fracture at the lateral base of the
distal phalanx in the right first toe.
